# Patient Record
Sex: Male | Born: 1995 | Race: Black or African American | Hispanic: No | Marital: Single | Smoking: Current every day smoker
Health system: Southern US, Community
[De-identification: ages and names within clinical notes are randomized; demographics above are authoritative.]

## PROBLEM LIST (undated history)

## (undated) DIAGNOSIS — J45909 Unspecified asthma, uncomplicated: Secondary | ICD-10-CM

---
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
## 2014-06-06 ENCOUNTER — Emergency Department
Admit: 2014-06-06 | Disposition: A | Discharge status: Home / Self Care | Payer: Self-pay | Admitting: Emergency Medicine

## 2015-12-13 IMAGING — CR RIGHT ANKLE - COMPLETE 3+ VIEW
1 series · 3 of 3 positions shown · non-contrast
Comparison: August 19, 2010

CLINICAL DATA: Pain following twisting type injury earlier today

EXAM:
RIGHT ANKLE - COMPLETE 3+ VIEW

[Series 1: ap · 0.17mm/px · 3 of 3 slices shown]
[im 1/3]
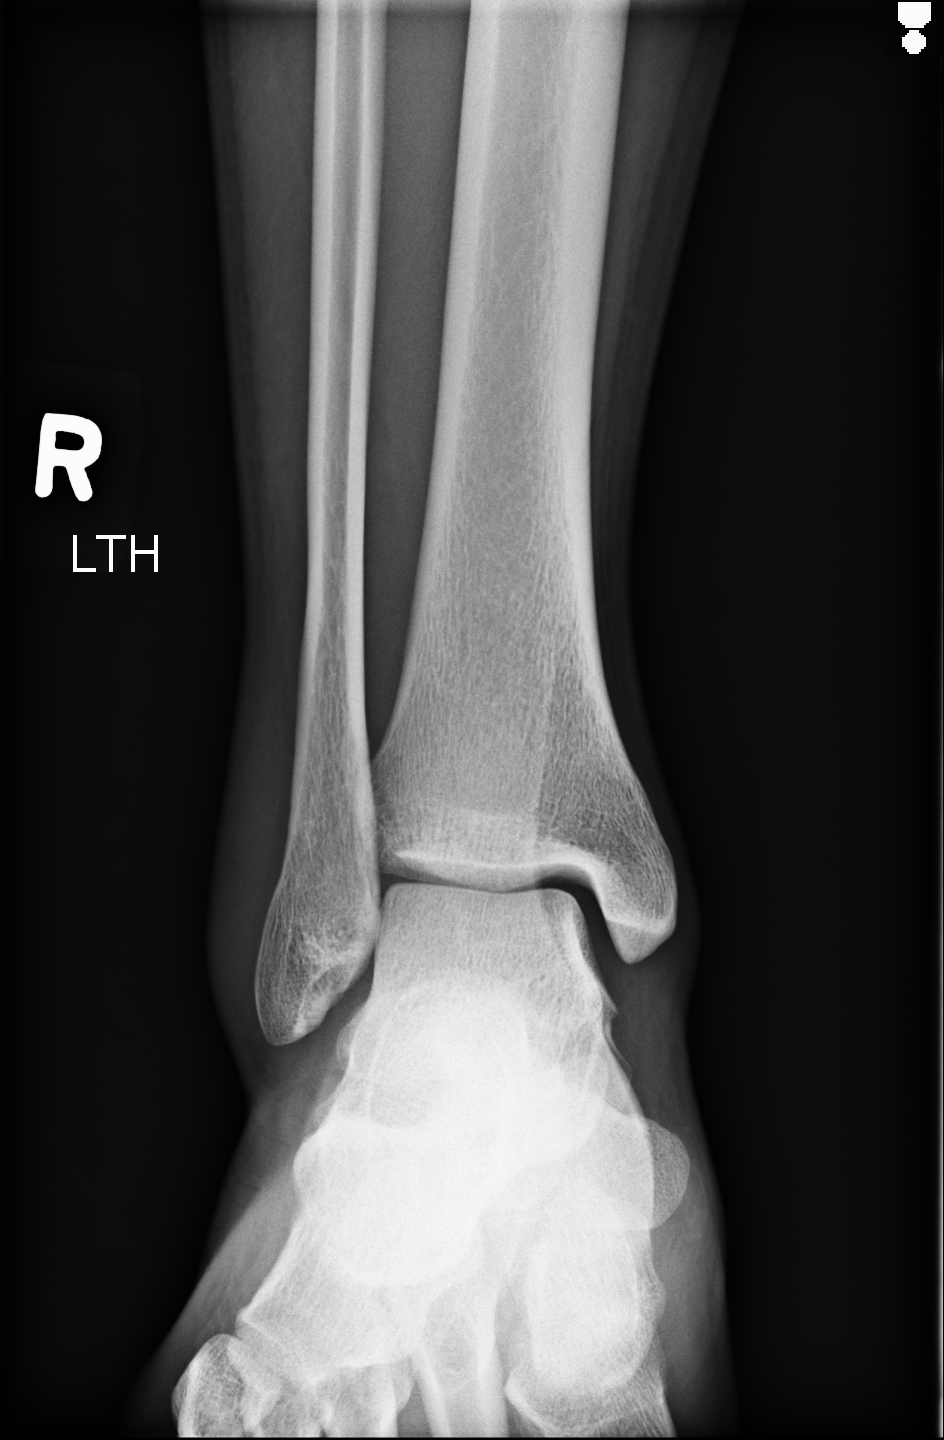
[im 2/3]
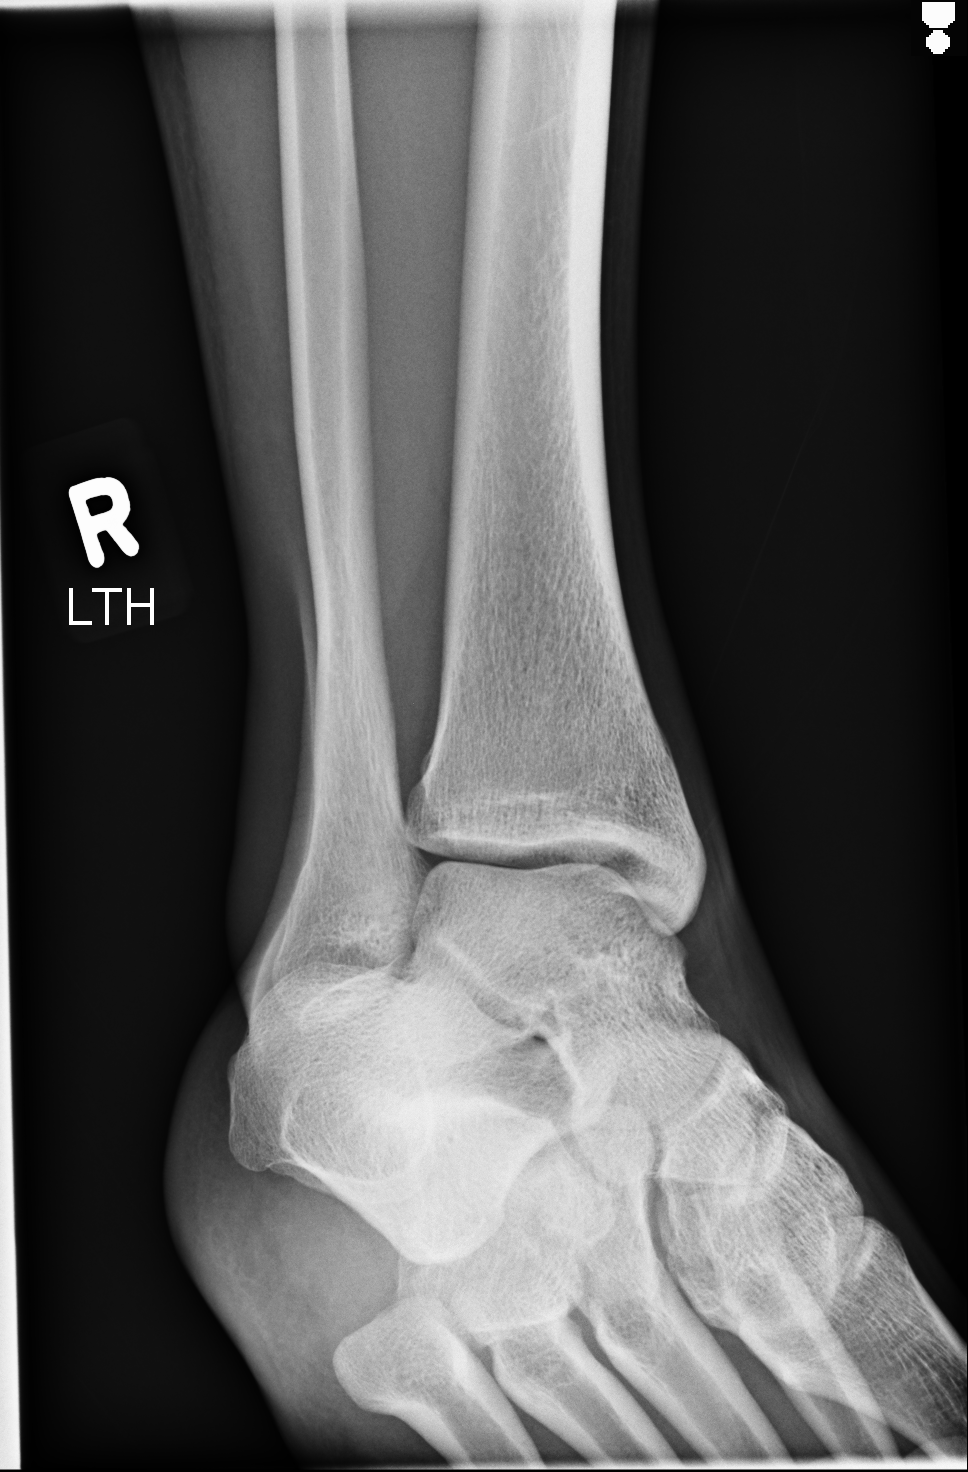
[im 3/3]
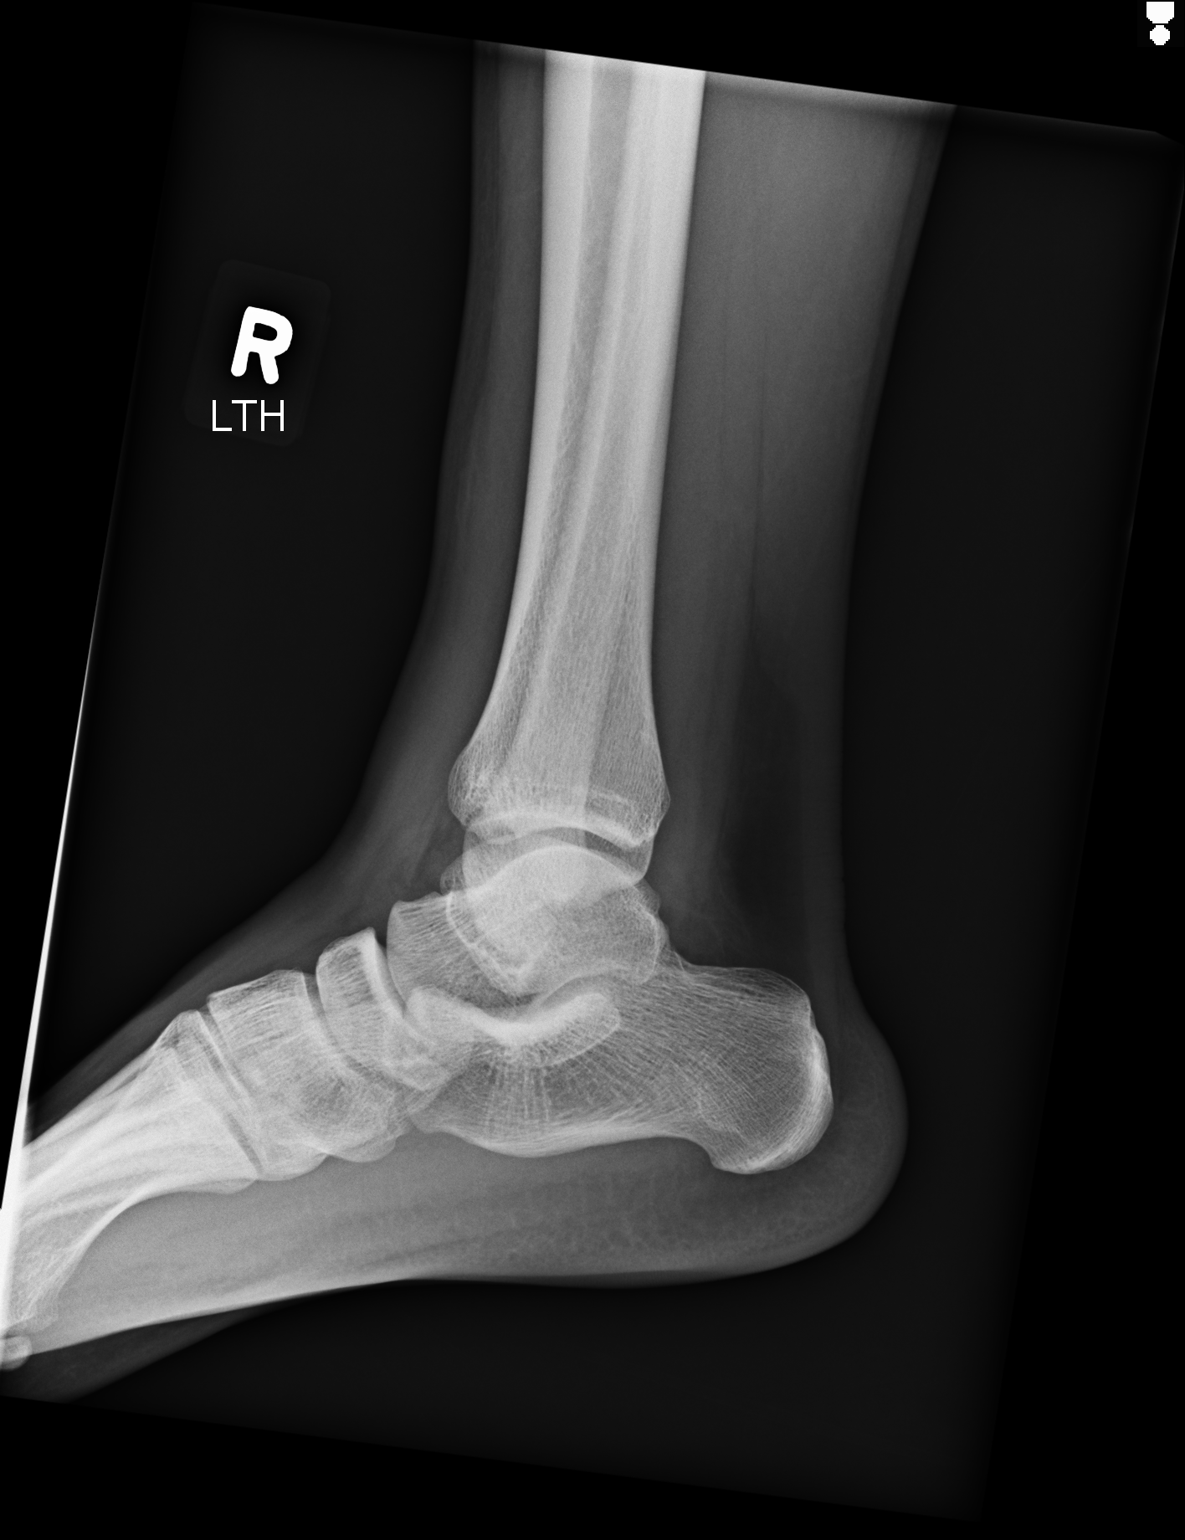

[3 of 3 positions shown; findings below may reference images not displayed]

FINDINGS: There is soft tissue swelling laterally. There is no fracture or
joint effusion. The ankle mortise appears intact.
IMPRESSION: Soft tissue swelling laterally.  No fracture.  Mortise intact.

## 2016-07-14 ENCOUNTER — Emergency Department: Payer: Self-pay

## 2016-07-14 ENCOUNTER — Emergency Department
Admission: EM | Admit: 2016-07-14 | Discharge: 2016-07-14 | Disposition: A | Discharge status: Home / Self Care | Payer: Self-pay | Attending: Emergency Medicine | Admitting: Emergency Medicine

## 2016-07-14 ENCOUNTER — Encounter: Payer: Self-pay | Admitting: Emergency Medicine

## 2016-07-14 DIAGNOSIS — Y990 Civilian activity done for income or pay: Secondary | ICD-10-CM | POA: Insufficient documentation

## 2016-07-14 DIAGNOSIS — W208XXA Other cause of strike by thrown, projected or falling object, initial encounter: Secondary | ICD-10-CM | POA: Insufficient documentation

## 2016-07-14 DIAGNOSIS — S60221A Contusion of right hand, initial encounter: Secondary | ICD-10-CM | POA: Insufficient documentation

## 2016-07-14 DIAGNOSIS — F17200 Nicotine dependence, unspecified, uncomplicated: Secondary | ICD-10-CM | POA: Insufficient documentation

## 2016-07-14 DIAGNOSIS — Y9289 Other specified places as the place of occurrence of the external cause: Secondary | ICD-10-CM | POA: Insufficient documentation

## 2016-07-14 DIAGNOSIS — J45909 Unspecified asthma, uncomplicated: Secondary | ICD-10-CM | POA: Insufficient documentation

## 2016-07-14 DIAGNOSIS — Y939 Activity, unspecified: Secondary | ICD-10-CM | POA: Insufficient documentation

## 2016-07-14 HISTORY — DX: Unspecified asthma, uncomplicated: J45.909

## 2016-07-14 MED ORDER — IBUPROFEN 800 MG PO TABS
800.0000 mg | ORAL_TABLET | Freq: Once | ORAL | Status: AC
Start: 2016-07-14 — End: 2016-07-14
  Administered 2016-07-14: 800 mg via ORAL
  Filled 2016-07-14: qty 1

## 2016-07-14 NOTE — Discharge Instructions (Signed)
1. Wear splint as needed for comfort. 2. You may take Tylenol and/or ibuprofen as needed for discomfort. 3. Return to the ER for worsening symptoms, persistent vomiting, difficult breathing or other concerns.

## 2016-07-14 NOTE — ED Provider Notes (Signed)
[facility] Emergency Department Provider Note   ____________________________________________   First MD Initiated Contact with Patient 07/14/16 [phone]     (approximate)  I have reviewed the triage vital signs and the nursing notes.   HISTORY  Chief Complaint Hand Pain    HPI Nathaniel Perez is a 21 y.o. male who presents to the ED from home with a chief complaint of right hand pain. Patient works at [employer], and states he dropped a heavy metal pole onto the back of his right hand yesterday. Presents with pain and swelling to the back of his right hand. Denies associated numbness/tingling. He does not wish to file a workman's comp.Denies other injuries and voices no other complaints. Patient is right hand dominant.   Past Medical History:  Diagnosis Date  . Asthma     There are no active problems to display for this patient.   History reviewed. No pertinent surgical history.  Prior to Admission medications   Not on File    Allergies Amoxicillin  No family history on file.  Social History Social History  Substance Use Topics  . Smoking status: Current Every Day Smoker  . Smokeless tobacco: Never Used  . Alcohol use Not on file    Review of Systems  Constitutional: No fever/chills. Eyes: No visual changes. ENT: No sore throat. Cardiovascular: Denies chest pain. Respiratory: Denies shortness of breath. Gastrointestinal: No abdominal pain.  No nausea, no vomiting.  No diarrhea.  No constipation. Genitourinary: Negative for dysuria. Musculoskeletal: Positive for right hand pain. Negative for back pain. Skin: Negative for rash. Neurological: Negative for headaches, focal weakness or numbness.   ____________________________________________   PHYSICAL EXAM:  VITAL SIGNS: ED Triage Vitals  Enc Vitals Group     BP 07/14/16 0557 114/77     Pulse Rate 07/14/16 0557 66     Resp 07/14/16 0557 18     Temp 07/14/16 0557 98.4 F (36.9 C)      Temp Source 07/14/16 0557 Oral     SpO2 07/14/16 0557 99 %     Weight 07/14/16 0548 150 lb (68 kg)     Height 07/14/16 0548 5\' 9"  (1.753 m)     Head Circumference --      Peak Flow --      Pain Score 07/14/16 0548 7     Pain Loc --      Pain Edu? --      Excl. in GC? --     Constitutional: Alert and oriented. Well appearing and in no acute distress. Eyes: Conjunctivae are normal. PERRL. EOMI. Head: Atraumatic. Nose: No congestion/rhinnorhea. Mouth/Throat: Mucous membranes are moist.  Oropharynx non-erythematous. Neck: No stridor.   Cardiovascular: Normal rate, regular rhythm. Grossly normal heart sounds.  Good peripheral circulation. Respiratory: Normal respiratory effort.  No retractions. Lungs CTAB. Gastrointestinal: Soft and nontender. No distention. No abdominal bruits. No CVA tenderness. Musculoskeletal:  Right hand: No swelling, deformity or external evidence of injury. Mildly tender to palpation over dorsal second metatarsal joint. Full range of motion with some pain. 2+ radial pulse. Brisk, less than 5 second capillary refill. No nail injury. Neurologic:  Normal speech and language. No gross focal neurologic deficits are appreciated. No gait instability. Skin:  Skin is warm, dry and intact. No rash noted. Psychiatric: Mood and affect are normal. Speech and behavior are normal.  ____________________________________________   LABS (all labs ordered are listed, but only abnormal results are displayed)  Labs Reviewed - No data to  display ____________________________________________  EKG  None ____________________________________________  RADIOLOGY  Right hand complete interpreted per Dr. Chase PicketHerman: No acute fracture or dislocation of the right hand. ____________________________________________   PROCEDURES  Procedure(s) performed:   SPLINT APPLICATION Date/Time: 6:56 AM Authorized by: Irean HongSUNG,JADE J Consent: Verbal consent obtained. Risks and benefits: risks,  benefits and alternatives were discussed Consent given by: patient Splint applied by: ED technician Location details: Right hand  Splint type: Volar Supplies used: OCL  Post-procedure: The splinted body part was neurovascularly unchanged following the procedure. Patient tolerance: Patient tolerated the procedure well with no immediate complications.   Procedures  Critical Care performed: No  ____________________________________________   INITIAL IMPRESSION / ASSESSMENT AND PLAN / ED COURSE  Pertinent labs & imaging results that were available during my care of the patient were reviewed by me and considered in my medical decision making (see chart for details).  21 year old male who presents with right hand pain secondary to minor crush injury/contusion. X-rays negative for fracture or dislocation. He was splinted for comfort and will follow up with orthopedics as needed. Strict return precautions given. Patient verbalizes understanding and agrees with plan of care.      ____________________________________________   FINAL CLINICAL IMPRESSION(S) / ED DIAGNOSES  Final diagnoses:  Contusion of right hand, initial encounter      NEW MEDICATIONS STARTED DURING THIS VISIT:  There are no discharge medications for this patient.    Note:  This document was prepared using Dragon voice recognition software and may include unintentional dictation errors.    Irean HongSung, Jade J, MD 07/14/16 [phone]

## 2016-07-14 NOTE — ED Notes (Addendum)
DG at bedside  Icepack applied to affected area

## 2016-07-14 NOTE — ED Triage Notes (Addendum)
Patient states that he dropped a metal pole on his right hand yesterday. Patient with pain and swelling to his right second finger. Patient states that he does not want to file workers comp.

## 2018-01-20 IMAGING — DX DG HAND COMPLETE 3+V*R*
3 series · 3 of 3 positions shown · non-contrast
Comparison: Right hand radiograph 02/14/2013

CLINICAL DATA: Crush injury with pain.

EXAM:
RIGHT HAND - COMPLETE 3+ VIEW

[hand ap]
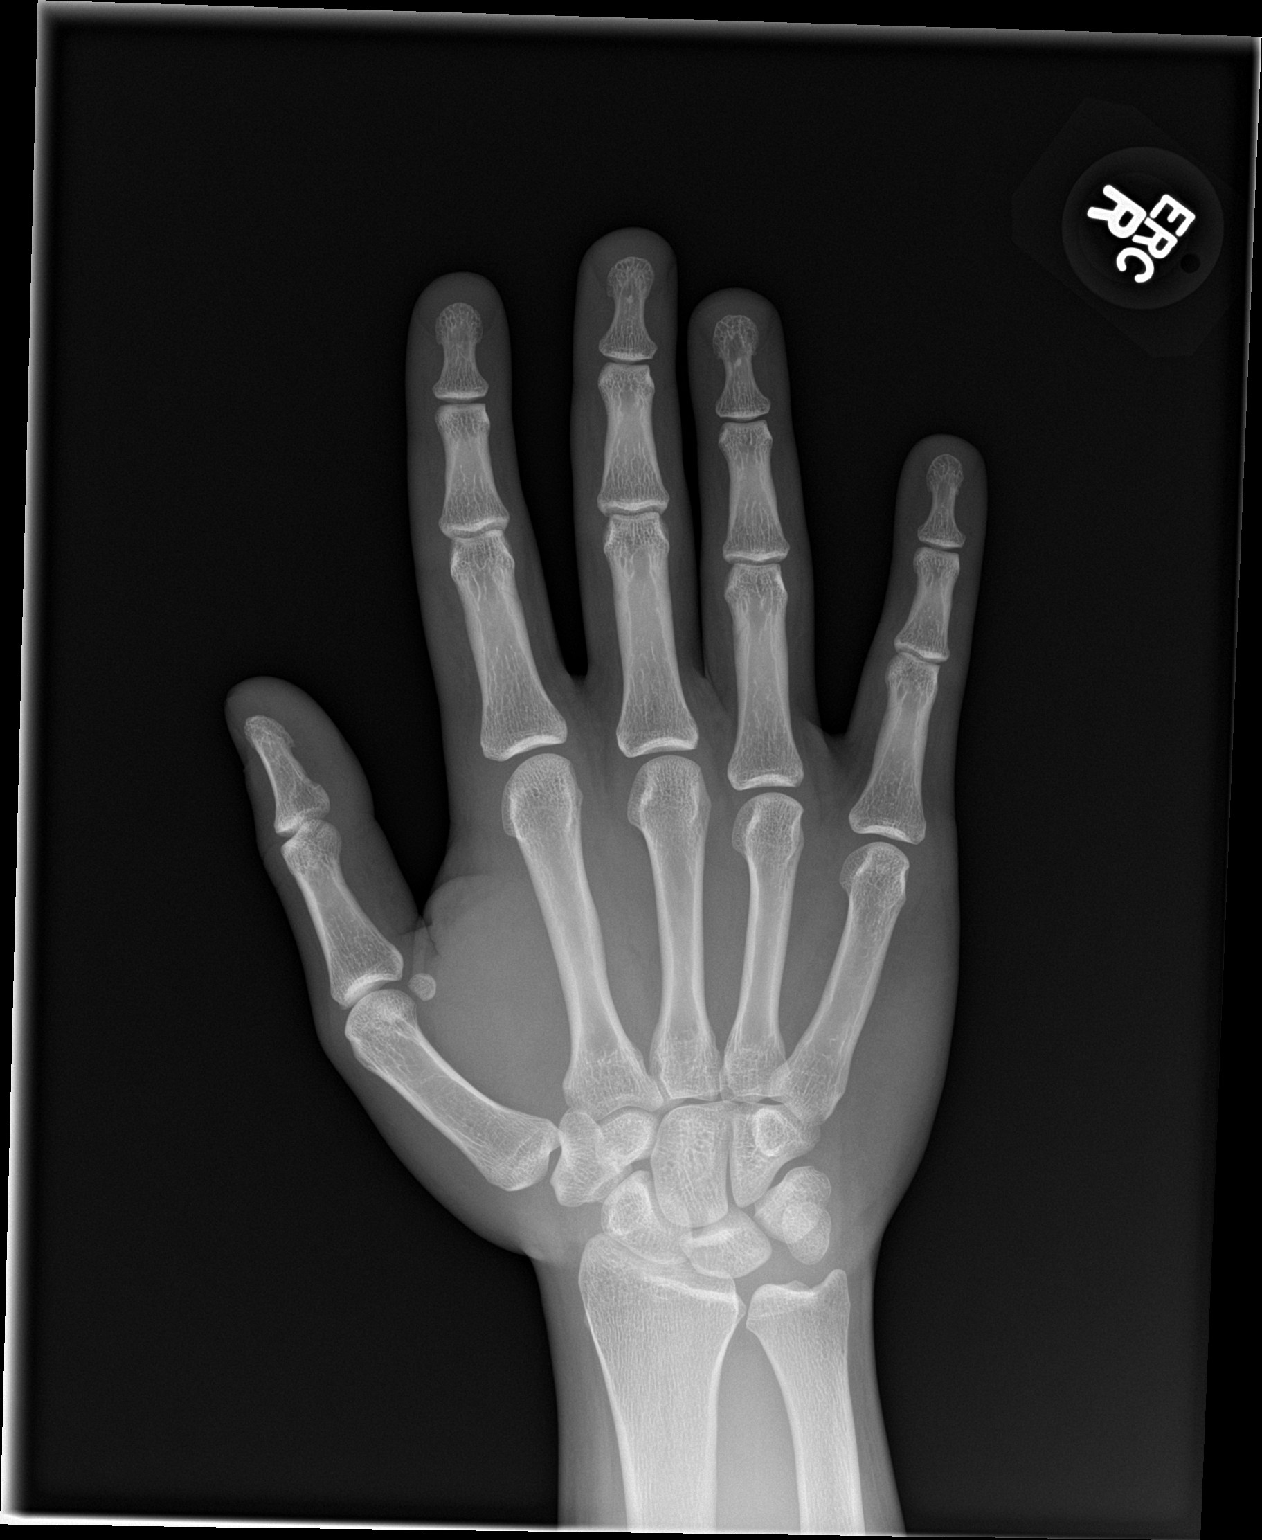

[hand obl]
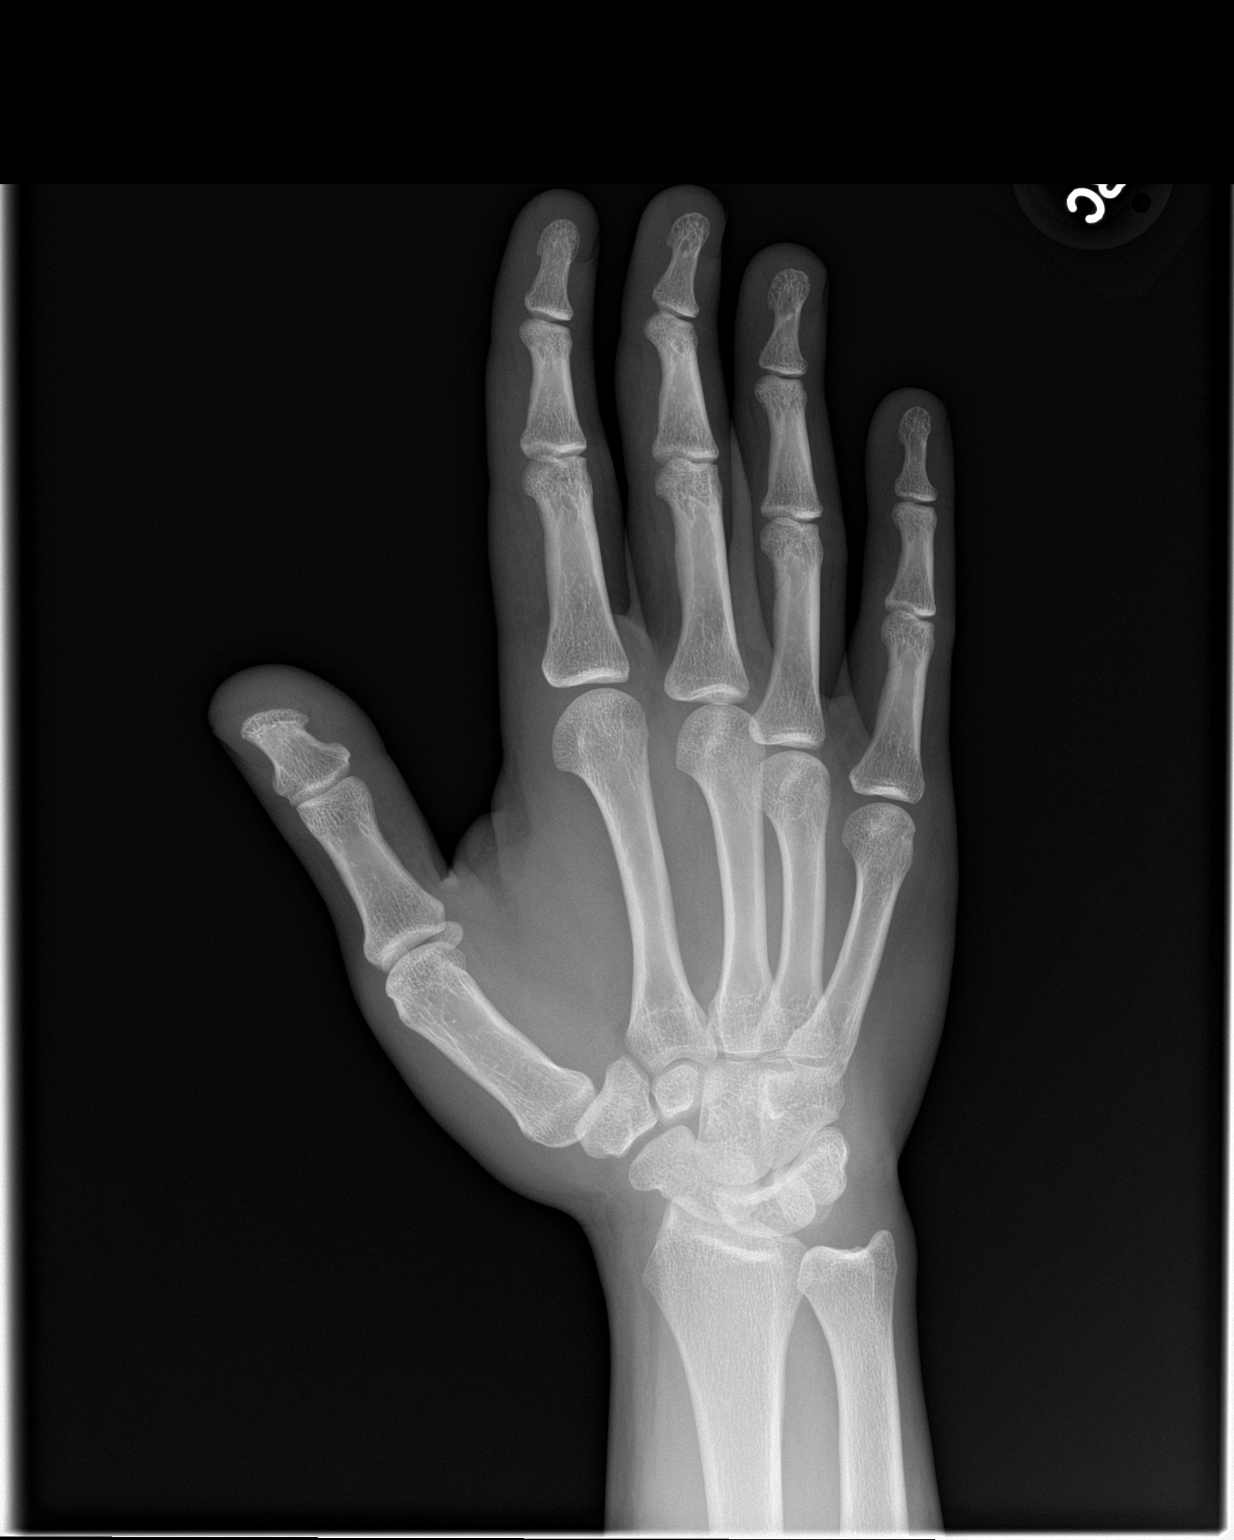

[hand lat]
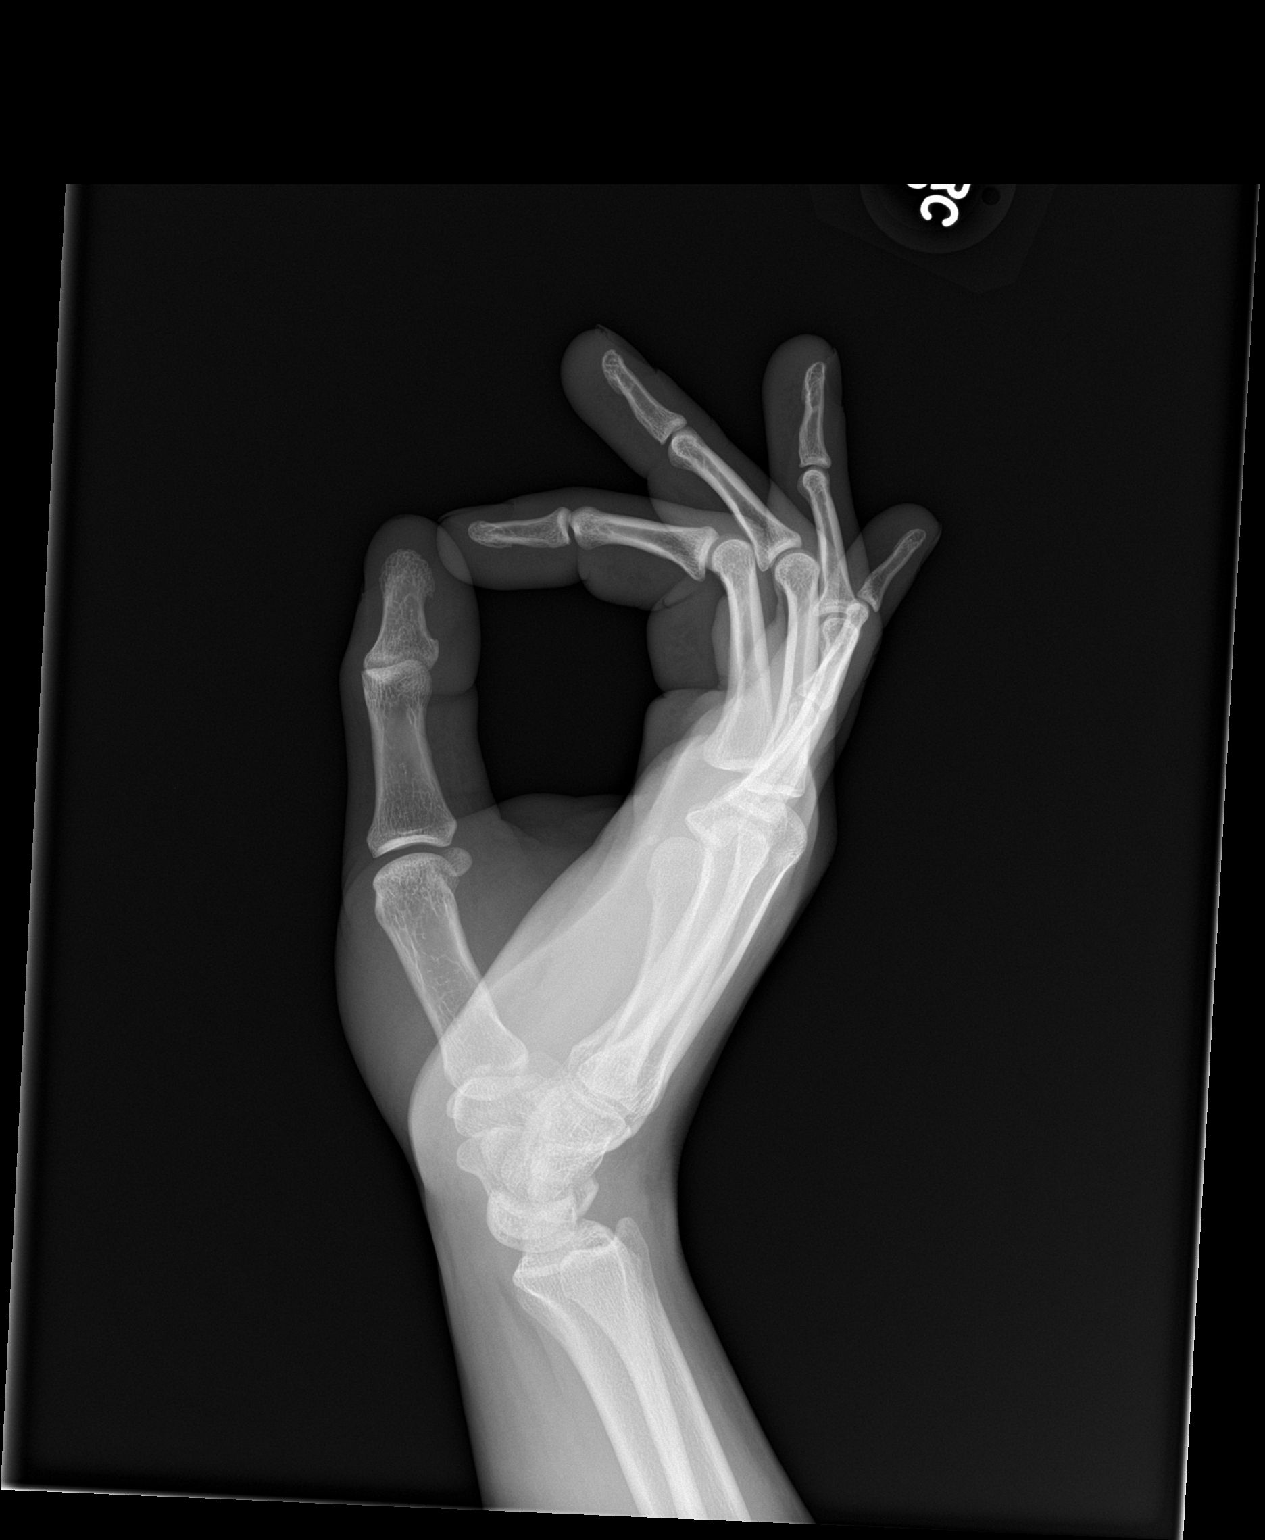

[3 of 3 positions shown; findings below may reference images not displayed]

FINDINGS: There is no evidence of fracture or dislocation. There is no
evidence of arthropathy or other focal bone abnormality. Soft
tissues are moderately swollen.
IMPRESSION: No acute fracture or dislocation of the right hand.

## 2022-03-27 ENCOUNTER — Encounter: Payer: Self-pay | Admitting: Family

## 2022-03-27 ENCOUNTER — Ambulatory Visit: Payer: Self-pay | Admitting: Family

## 2022-03-27 DIAGNOSIS — Z202 Contact with and (suspected) exposure to infections with a predominantly sexual mode of transmission: Secondary | ICD-10-CM

## 2022-03-27 DIAGNOSIS — Z113 Encounter for screening for infections with a predominantly sexual mode of transmission: Secondary | ICD-10-CM

## 2022-03-27 MED ORDER — DOXYCYCLINE HYCLATE 100 MG PO TABS: 100.0000 mg | ORAL_TABLET | Freq: Two times a day (BID) | ORAL | 0 refills | Status: AC

## 2022-03-27 NOTE — Progress Notes (Signed)
[facility] Department STI clinic/screening visit  Subjective:  Nathaniel Perez is a 27 y.o. male being seen today for an STI screening visit. The patient reports they do not have symptoms.    Patient has the following medical conditions:  There are no problems to display for this patient.    Chief Complaint  Patient presents with   SEXUALLY TRANSMITTED DISEASE    Patient was exposed to chlamydia     HPI  Patient reports that his girlfriend was diagnosed with chlamydia 2 weeks ago. He thinks that he gave it to her, but does not have any symptoms. Last sex more than 2 weeks ago.  Last HIV test per patient/review of record was No results found for: "HMHIVSCREEN" No results found for: "HIV"  Does the patient or their partner desires a pregnancy in the next year? No  Screening for MPX risk: Does the patient have an unexplained rash? No Is the patient MSM? No Does the patient endorse multiple sex partners or anonymous sex partners? No Did the patient have close or sexual contact with a person diagnosed with MPX? No Has the patient traveled outside the [place] where MPX is endemic? No Is there a high clinical suspicion for MPX-- evidenced by one of the following No  -Unlikely to be chickenpox  -Lymphadenopathy  -Rash that present in same phase of evolution on any given body part   See flowsheet for further details and programmatic requirements.    There is no immunization history on file for this patient.   The following portions of the patient's history were reviewed and updated as appropriate: allergies, current medications, past medical history, past social history, past surgical history and problem list.  Objective:  There were no vitals filed for this visit.  Physical Exam Constitutional:      Appearance: Normal appearance.  HENT:     Head: Normocephalic and atraumatic.     Comments: No nits or hair loss    Mouth/Throat:     Mouth: Mucous membranes are  moist. No oral lesions.     Pharynx: Oropharynx is clear. No oropharyngeal exudate or posterior oropharyngeal erythema.  Eyes:     General:        Right eye: No discharge.        Left eye: No discharge.     Conjunctiva/sclera:     Right eye: Right conjunctiva is not injected. No exudate.    Left eye: Left conjunctiva is not injected. No exudate. Pulmonary:     Effort: Pulmonary effort is normal.  Abdominal:     General: Abdomen is flat.     Palpations: Abdomen is soft. There is no hepatomegaly or mass.     Tenderness: There is no abdominal tenderness. There is no rebound.     Hernia: There is no hernia in the left inguinal area or right inguinal area.  Genitourinary:    Pubic Area: No rash or pubic lice (no nits).      Penis: Normal. No tenderness, discharge, swelling or lesions.      Testes: Normal.     Epididymis:     Right: Normal. No mass or tenderness.     Left: Normal. No mass or tenderness.     Rectum: Normal. No tenderness (no lesions or discharge).  Lymphadenopathy:     Head:     Right side of head: No preauricular or posterior auricular adenopathy.     Left side of head: No preauricular or posterior auricular adenopathy.  Cervical: No cervical adenopathy.     Upper Body:     Right upper body: No supraclavicular, axillary or epitrochlear adenopathy.     Left upper body: No supraclavicular, axillary or epitrochlear adenopathy.     Lower Body: No right inguinal adenopathy. No left inguinal adenopathy.  Skin:    General: Skin is warm and dry.     Findings: No lesion or rash.  Neurological:     Mental Status: He is alert and oriented to person, place, and time.     Assessment and Plan:  Nathaniel Perez is a 28 y.o. male presenting to the [facility] Department for STI screening  1. Exposure to venereal disease Contact to Chlamydia Treat with Doxycycline per standing orders Abstain from sex for next 2 weeks Condoms for all sex thereafter  2.  Screening for venereal disease Will contact if positive results   - Chlamydia/GC NAA, Confirmation   Patient does not have STI symptoms Patient accepted all screenings including   Patient meets criteria for HepB screening? No. Ordered? no Patient meets criteria for HepC screening? No. Ordered? no Recommended condom use with all sex Discussed importance of condom use for STI prevent  Treat gram stain per standing order Discussed time line for [facility] results and that patient will be called with positive results and encouraged patient to call if he had not heard in 2 weeks Recommended repeat testing in 3 months with positive results. Recommended returning for continued or worsening symptoms.   Return if symptoms worsen or fail to improve.  No future appointments.  Marline Backbone, FNP

## 2022-03-27 NOTE — Progress Notes (Signed)
Pt appointment for STI screening and treatment. Pt a contact to Chlamydia. Seen by [place]. Doxycycline dispensed per order with instructions.

## 2022-04-01 LAB — C. TRACHOMATIS NAA, CONFIRM: C. trachomatis NAA, Confirm: POSITIVE — AB

## 2022-04-01 LAB — CHLAMYDIA/GC NAA, CONFIRMATION
Chlamydia trachomatis, NAA: POSITIVE — AB
Neisseria gonorrhoeae, NAA: NEGATIVE

## 2022-04-02 ENCOUNTER — Telehealth: Payer: Self-pay

## 2022-04-02 NOTE — Telephone Encounter (Signed)
No additional notes
